# Patient Record
Sex: Male | Born: 2000
Health system: Southern US, Community
[De-identification: ages and names within clinical notes are randomized; demographics above are authoritative.]

---
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
## 2022-07-21 ENCOUNTER — Ambulatory Visit: Payer: Self-pay

## 2022-07-21 ENCOUNTER — Encounter: Payer: Self-pay | Admitting: Physician Assistant

## 2022-07-21 ENCOUNTER — Ambulatory Visit: Payer: Self-pay | Admitting: Physician Assistant

## 2022-07-21 VITALS — BP 112/54 | HR 61 | Temp 98.4°F | Resp 14 | Ht 70.0 in | Wt 168.0 lb

## 2022-07-21 DIAGNOSIS — Z0289 Encounter for other administrative examinations: Secondary | ICD-10-CM

## 2022-07-21 LAB — POCT URINALYSIS DIPSTICK
Bilirubin, UA: NEGATIVE
Blood, UA: NEGATIVE
Glucose, UA: NEGATIVE
Ketones, UA: NEGATIVE
Leukocytes, UA: NEGATIVE
Nitrite, UA: NEGATIVE
Protein, UA: NEGATIVE
Spec Grav, UA: 1.015 (ref 1.010–1.025)
Urobilinogen, UA: 0.2 E.U./dL
pH, UA: 6 (ref 5.0–8.0)

## 2022-07-21 NOTE — Progress Notes (Signed)
[facility] occupational health clinic ____________________________________________   None    (approximate)  I have reviewed the triage vital signs and the nursing notes.   HISTORY  Chief Complaint No chief complaint on file.   HPI Asahd Can Vandergriff is a 22 y.o. male patient presents for preemployment exam for [employer].  Patient voiced no concerns or complaints.        No past medical history on file.  Patient Active Problem List   Diagnosis Date Noted   Vascular lesion 01/27/2017   Nondisplaced fracture of shaft of third metacarpal bone of left hand 07/08/2013    No past surgical history on file.  Prior to Admission medications   Not on File    Allergies Patient has no known allergies.  No family history on file.  Social History Social History   Tobacco Use   Smoking status: Never  Substance Use Topics   Alcohol use: No    Review of Systems Constitutional: No fever/chills Eyes: No visual changes. ENT: No sore throat. Cardiovascular: Denies chest pain. Respiratory: Denies shortness of breath. Gastrointestinal: No abdominal pain.  No nausea, no vomiting.  No diarrhea.  No constipation. Genitourinary: Negative for dysuria. Musculoskeletal: Negative for back pain. Skin: Negative for rash. Neurological: Negative for headaches, focal weakness or numbness.  ____________________________________________   PHYSICAL EXAM:  VITAL SIGNS: BP 112/54  Pulse 61  Resp 14  Temp 98.4 F (36.9 C)  Temp src Temporal  SpO2 98 %  Weight 168 lb (76.2 kg)  Height 5\' 10"  (1.778 m)   BMI 24.11 kg/m2  BSA 1.94 m2  Tobacco  Smoking status Never   Constitutional: Alert and oriented. Well appearing and in no acute distress. Eyes: Conjunctivae are normal. PERRL. EOMI. Head: Atraumatic. Nose: No congestion/rhinnorhea. Mouth/Throat: Mucous membranes are moist.  Oropharynx non-erythematous. Neck: No stridor.  No cervical spine tenderness to  palpation. Hematological/Lymphatic/Immunilogical: No cervical lymphadenopathy. Cardiovascular: Normal rate, regular rhythm. Grossly normal heart sounds.  Good peripheral circulation. Respiratory: Normal respiratory effort.  No retractions. Lungs CTAB. Gastrointestinal: Soft and nontender. No distention. No abdominal bruits. No CVA tenderness. Genitourinary: Deferred Musculoskeletal: No lower extremity tenderness nor edema.  No joint effusions. Neurologic:  Normal speech and language. No gross focal neurologic deficits are appreciated. No gait instability. Skin:  Skin is warm, dry and intact. No rash noted. Psychiatric: Mood and affect are normal. Speech and behavior are normal.  ____________________________________________   LABS      Component Ref Range & Units 14:30  Color, UA yellow  Clarity, UA clear  Glucose, UA Negative Negative  Bilirubin, UA neg  Ketones, UA neg  Spec Grav, UA 1.010 - 1.025 1.015  Blood, UA neg  pH, UA 5.0 - 8.0 6.0  Protein, UA Negative Negative  Urobilinogen, UA 0.2 or 1.0 E.U./dL 0.2  Nitrite, UA neg  Leukocytes, UA Negative Negative  Appearance        _Pending ___________________________________________  EKG   Normal sinus rhythm at 66 bpm ____________________________________________    ____________________________________________   INITIAL IMPRESSION / ASSESSMENT AND PLAN   As part of my medical decision making, I reviewed the following data within the electronic MEDICAL RECORD NUMBER      No acute findings on physical exam or EKG.  Labs are pending.        ____________________________________________   FINAL CLINICAL IMPRESSION Well exam   ED Discharge Orders     None        Note:  This  document was prepared using Conservation officer, historic buildings and may include unintentional dictation errors.

## 2022-07-21 NOTE — Progress Notes (Signed)
Pt presents today to complete pre-employment FF physical, Pt cleared UDS. Labs/UA/EKG complete. Gretel Acre

## 2022-07-21 NOTE — Progress Notes (Signed)
Pt presents today to complete pre-employment FF physical, Pt cleared UDS. Labs/UA/EKG complete. Dean Walls  Pt denies any issues or concerns at this time/CL,RMA

## 2022-07-24 LAB — CMP12+LP+TP+TSH+6AC+CBC/D/PLT
ALT: 14 IU/L (ref 0–44)
AST: 17 IU/L (ref 0–40)
Albumin: 4.8 g/dL (ref 4.3–5.2)
Alkaline Phosphatase: 63 IU/L (ref 44–121)
BUN/Creatinine Ratio: 14 (ref 9–20)
BUN: 13 mg/dL (ref 6–20)
Basophils Absolute: 0 x10E3/uL (ref 0.0–0.2)
Basos: 1 %
Bilirubin Total: 0.7 mg/dL (ref 0.0–1.2)
Calcium: 9.7 mg/dL (ref 8.7–10.2)
Chloride: 101 mmol/L (ref 96–106)
Chol/HDL Ratio: 3.1 ratio (ref 0.0–5.0)
Cholesterol, Total: 163 mg/dL (ref 100–199)
Creatinine, Ser: 0.93 mg/dL (ref 0.76–1.27)
EOS (ABSOLUTE): 0.1 x10E3/uL (ref 0.0–0.4)
Eos: 2 %
Free Thyroxine Index: 2.1 (ref 1.2–4.9)
GGT: 14 IU/L (ref 0–65)
Globulin, Total: 2 g/dL (ref 1.5–4.5)
Glucose: 93 mg/dL (ref 70–99)
HDL: 52 mg/dL
Hematocrit: 45.9 % (ref 37.5–51.0)
Hemoglobin: 15.3 g/dL (ref 13.0–17.7)
Immature Grans (Abs): 0 x10E3/uL (ref 0.0–0.1)
Immature Granulocytes: 0 %
Iron: 160 ug/dL (ref 38–169)
LDH: 150 IU/L (ref 121–224)
LDL Chol Calc (NIH): 92 mg/dL (ref 0–99)
Lymphocytes Absolute: 1.9 x10E3/uL (ref 0.7–3.1)
Lymphs: 29 %
MCH: 30.2 pg (ref 26.6–33.0)
MCHC: 33.3 g/dL (ref 31.5–35.7)
MCV: 91 fL (ref 79–97)
Monocytes Absolute: 0.6 x10E3/uL (ref 0.1–0.9)
Monocytes: 9 %
Neutrophils Absolute: 3.9 x10E3/uL (ref 1.4–7.0)
Neutrophils: 59 %
Phosphorus: 3.5 mg/dL (ref 2.8–4.1)
Platelets: 242 x10E3/uL (ref 150–450)
Potassium: 3.9 mmol/L (ref 3.5–5.2)
RBC: 5.07 x10E6/uL (ref 4.14–5.80)
RDW: 12.5 % (ref 11.6–15.4)
Sodium: 141 mmol/L (ref 134–144)
T3 Uptake Ratio: 28 % (ref 24–39)
T4, Total: 7.4 ug/dL (ref 4.5–12.0)
TSH: 0.596 u[IU]/mL (ref 0.450–4.500)
Total Protein: 6.8 g/dL (ref 6.0–8.5)
Triglycerides: 104 mg/dL (ref 0–149)
Uric Acid: 5 mg/dL (ref 3.8–8.4)
VLDL Cholesterol Cal: 19 mg/dL (ref 5–40)
WBC: 6.5 x10E3/uL (ref 3.4–10.8)
eGFR: 120 mL/min/1.73

## 2022-07-24 LAB — QUANTIFERON-TB GOLD PLUS
QuantiFERON Mitogen Value: >10 [IU]/mL
QuantiFERON Nil Value: 0.02 [IU]/mL
QuantiFERON TB1 Ag Value: 0.03 [IU]/mL
QuantiFERON TB2 Ag Value: 0.03 [IU]/mL
QuantiFERON-TB Gold Plus: NEGATIVE

## 2022-07-24 LAB — HEPATITIS B SURFACE ANTIBODY,QUALITATIVE: Hep B Surface Ab, Qual: NONREACTIVE

## 2022-08-03 ENCOUNTER — Ambulatory Visit: Payer: Self-pay

## 2022-08-03 DIAGNOSIS — Z23 Encounter for immunization: Secondary | ICD-10-CM

## 2022-08-03 NOTE — Progress Notes (Signed)
Presents to clinic to pick up Physical forms to take to [employer] this Saturday (08/03/234).  While at clinic, advised his that Hep B titer collected on 07/21/2022 came back non-reactive. OSHA recommends a booster. Reviewed BBP's  Dean Walls consented to receiving a Hep B booster.  Informed [name] that we need to recheck the titer in 6 weeks.  States he will call clinic to completed.  Unsure of [employer] schedule at this time & doesn't want to make appointment today.  AMD
